# Patient Record
Sex: Male | Born: 1970
Health system: Southern US, Community
[De-identification: ages and names within clinical notes are randomized; demographics above are authoritative.]

## PROBLEM LIST (undated history)

## (undated) HISTORY — PX: ARTHROSCOPIC REPAIR ACL: SUR80

## (undated) HISTORY — PX: ACHILLES TENDON REPAIR: SUR1153

---

## 2001-03-19 ENCOUNTER — Encounter: Payer: Self-pay | Admitting: *Deleted

## 2001-03-19 ENCOUNTER — Emergency Department (HOSPITAL_COMMUNITY): Admission: EM | Admit: 2001-03-19 | Discharge: 2001-03-19 | Payer: Self-pay | Admitting: Emergency Medicine

## 2003-04-01 ENCOUNTER — Emergency Department (HOSPITAL_COMMUNITY): Admission: EM | Admit: 2003-04-01 | Discharge: 2003-04-01 | Payer: Self-pay | Admitting: Emergency Medicine

## 2009-01-12 ENCOUNTER — Emergency Department (HOSPITAL_COMMUNITY): Admission: EM | Admit: 2009-01-12 | Discharge: 2009-01-13 | Payer: Self-pay | Admitting: Emergency Medicine

## 2010-04-15 ENCOUNTER — Emergency Department (HOSPITAL_COMMUNITY): Payer: BC Managed Care – PPO

## 2010-04-15 ENCOUNTER — Emergency Department (HOSPITAL_COMMUNITY)
Admission: EM | Admit: 2010-04-15 | Discharge: 2010-04-16 | Disposition: A | Discharge status: Home / Self Care | Payer: BC Managed Care – PPO | Attending: Emergency Medicine | Admitting: Emergency Medicine

## 2010-04-15 DIAGNOSIS — X500XXA Overexertion from strenuous movement or load, initial encounter: Secondary | ICD-10-CM | POA: Insufficient documentation

## 2010-04-15 DIAGNOSIS — Y9367 Activity, basketball: Secondary | ICD-10-CM | POA: Insufficient documentation

## 2010-04-15 DIAGNOSIS — M66369 Spontaneous rupture of flexor tendons, unspecified lower leg: Secondary | ICD-10-CM | POA: Insufficient documentation

## 2010-04-15 DIAGNOSIS — S99929A Unspecified injury of unspecified foot, initial encounter: Secondary | ICD-10-CM | POA: Insufficient documentation

## 2010-04-15 DIAGNOSIS — S8990XA Unspecified injury of unspecified lower leg, initial encounter: Secondary | ICD-10-CM | POA: Insufficient documentation

## 2010-04-15 DIAGNOSIS — Y92838 Other recreation area as the place of occurrence of the external cause: Secondary | ICD-10-CM | POA: Insufficient documentation

## 2010-04-15 DIAGNOSIS — Y9239 Other specified sports and athletic area as the place of occurrence of the external cause: Secondary | ICD-10-CM | POA: Insufficient documentation

## 2010-04-15 IMAGING — CR DG ANKLE COMPLETE 3+V*R*
3 series · 3 of 3 positions shown · non-contrast
Comparison: None

CLINICAL DATA: Pain, injury playing basketball, felt pop
posteriorly, question Achilles injury

RIGHT ANKLE - COMPLETE 3+ VIEW

[t ankle joint ap right]
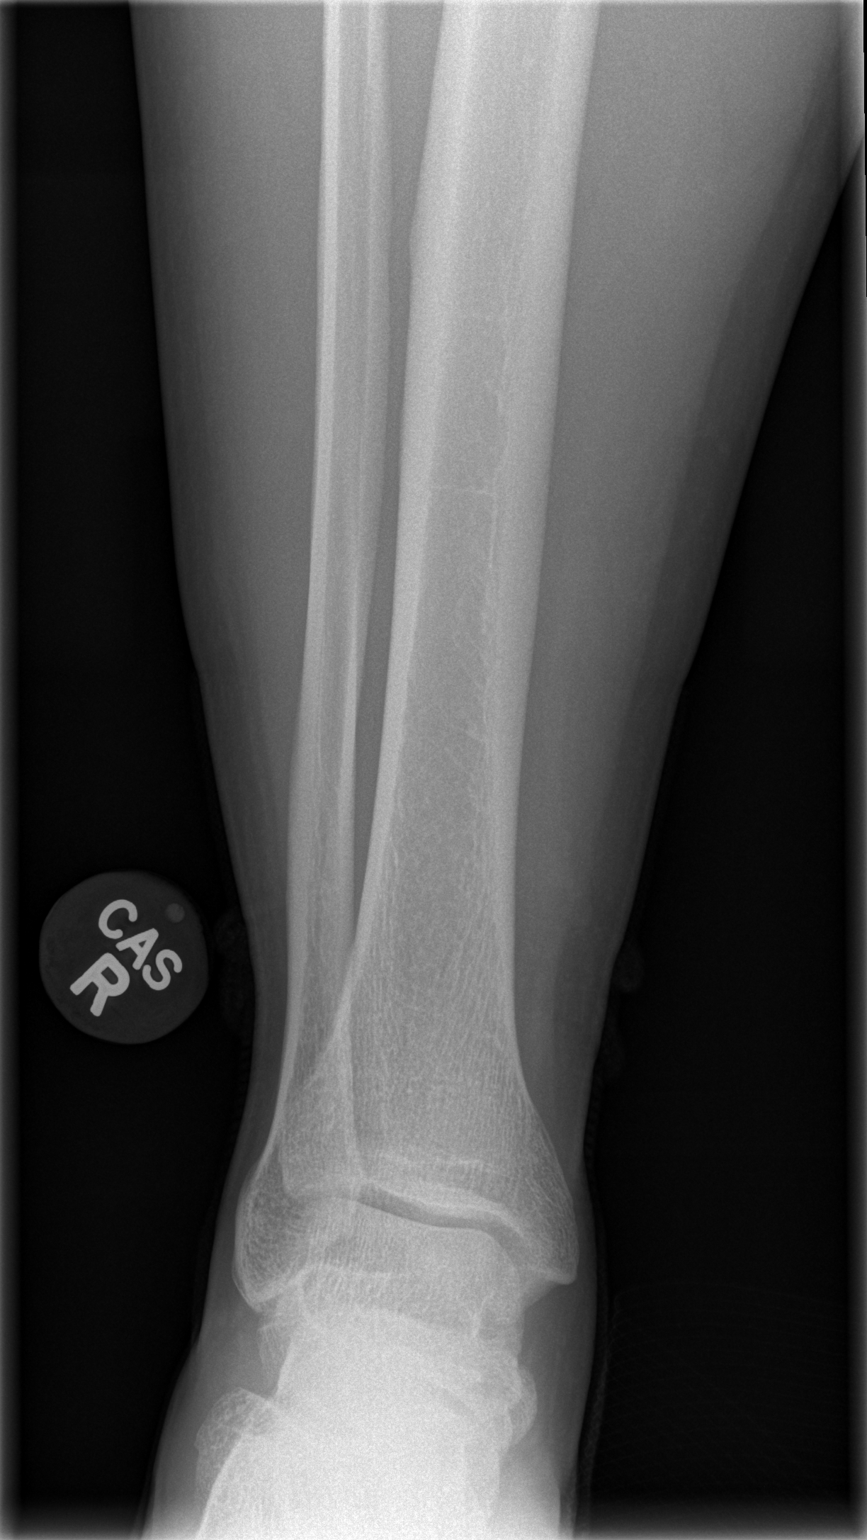

[t ankle joint oblique right]
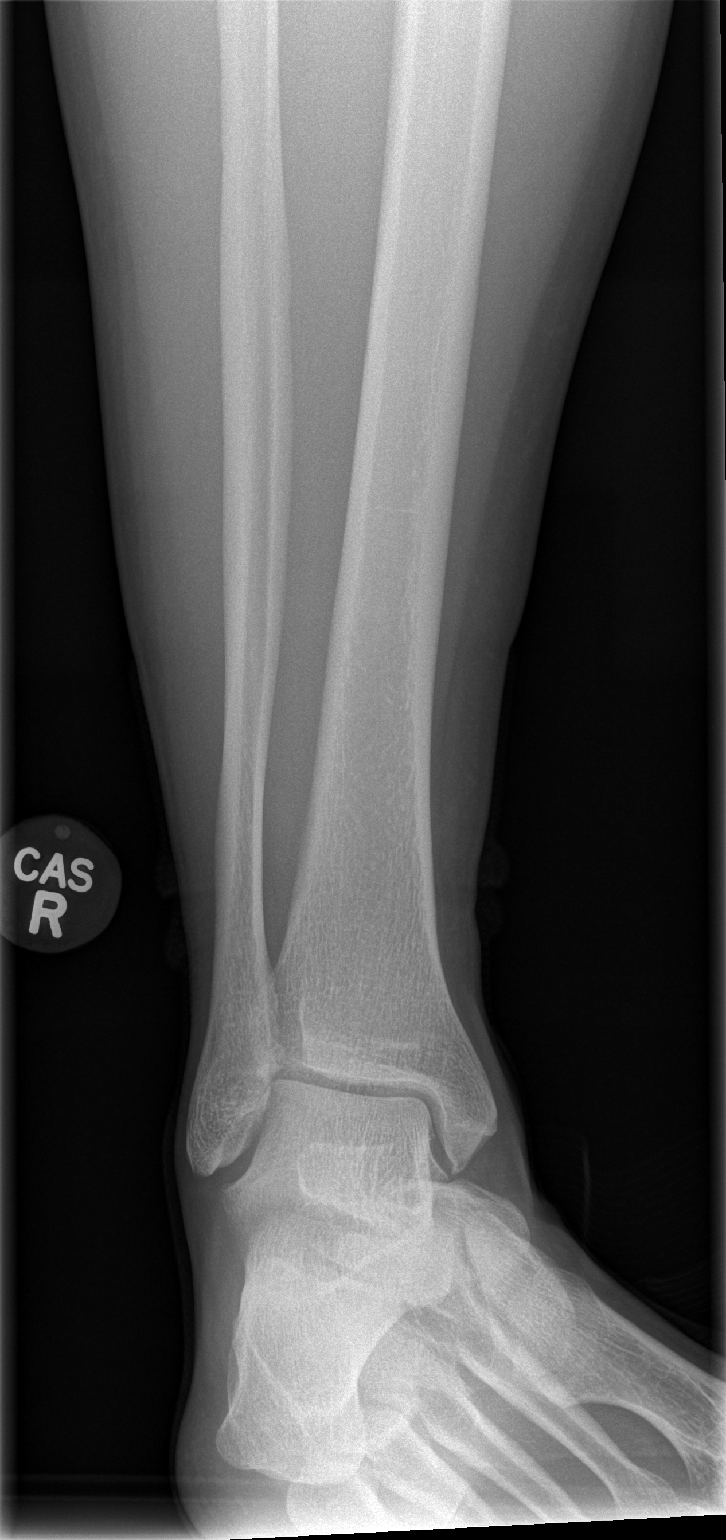

[t ankle joint lat right]
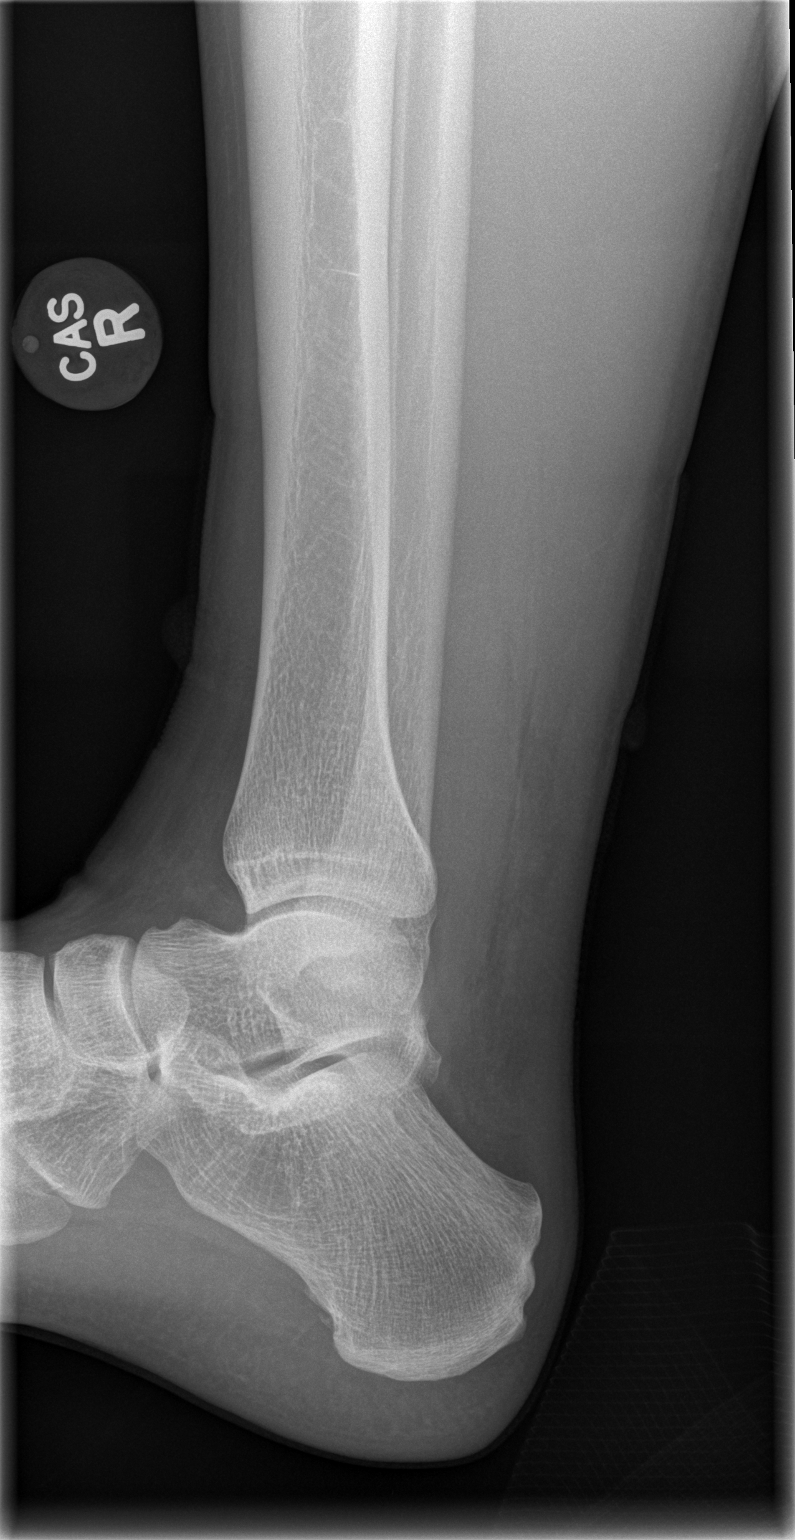

[3 of 3 positions shown; findings below may reference images not displayed]

FINDINGS: Ankle mortise intact.
Osseous mineralization normal.
No acute fracture or dislocation.
No bone destruction seen.
Question attenuation of the faint Achilles tendon silhouette at the
level of the musculotendinous junction, not well visualized.
IMPRESSION: No acute osseous abnormalities.
If there is clinical concern for Achilles tendon injury, recommend
MR imaging of the right ankle without contrast.

## 2010-04-21 ENCOUNTER — Other Ambulatory Visit: Payer: Self-pay | Admitting: Specialist

## 2010-04-21 ENCOUNTER — Encounter (HOSPITAL_COMMUNITY): Payer: BC Managed Care – PPO

## 2010-04-21 LAB — BASIC METABOLIC PANEL
BUN: 16 mg/dL (ref 6–23)
CO2: 29 mEq/L (ref 19–32)
Calcium: 9.4 mg/dL (ref 8.4–10.5)
Chloride: 103 mEq/L (ref 96–112)
Creatinine, Ser: 1.02 mg/dL (ref 0.4–1.5)
GFR calc Af Amer: >60 mL/min (ref >60)
GFR calc non Af Amer: >60 mL/min (ref >60)
Glucose, Bld: 94 mg/dL (ref 70–99)
Potassium: 4.3 mEq/L (ref 3.5–5.1)
Sodium: 138 mEq/L (ref 135–145)

## 2010-04-21 LAB — CBC
HCT: 43.5 % (ref 39.0–52.0)
Hemoglobin: 14.4 g/dL (ref 13.0–17.0)
MCH: 27.9 pg (ref 26.0–34.0)
MCHC: 33.1 g/dL (ref 30.0–36.0)
MCV: 84.1 fL (ref 78.0–100.0)
Platelets: 172 10*3/uL (ref 150–400)
RBC: 5.17 MIL/uL (ref 4.22–5.81)
RDW: 12.8 % (ref 11.5–15.5)
WBC: 4.7 10*3/uL (ref 4.0–10.5)

## 2010-04-21 LAB — SURGICAL PCR SCREEN
MRSA, PCR: NEGATIVE
Staphylococcus aureus: NEGATIVE

## 2010-04-23 ENCOUNTER — Observation Stay (HOSPITAL_COMMUNITY)
Admission: RE | Admit: 2010-04-23 | Discharge: 2010-04-24 | DRG: 227 | Disposition: A | Discharge status: Home / Self Care | Payer: BC Managed Care – PPO | Source: Ambulatory Visit | Attending: Specialist | Admitting: Specialist

## 2010-04-23 DIAGNOSIS — Z01812 Encounter for preprocedural laboratory examination: Secondary | ICD-10-CM

## 2010-04-23 DIAGNOSIS — S93499A Sprain of other ligament of unspecified ankle, initial encounter: Principal | ICD-10-CM | POA: Diagnosis present

## 2010-04-23 DIAGNOSIS — X58XXXA Exposure to other specified factors, initial encounter: Secondary | ICD-10-CM | POA: Diagnosis present

## 2010-05-24 NOTE — Op Note (Signed)
NAMEBRIDGER, Joshua Harding            ACCOUNT NO.:  [id number]  MEDICAL RECORD NO.:  [id number]           PATIENT TYPE:  I  LOCATION:  1605                         FACILITY:  [facility]  PHYSICIAN:  Jene Every, M.D.    DATE OF BIRTH:  [birth year]  DATE OF PROCEDURE:  04/23/2010 DATE OF DISCHARGE:  04/24/2010                              OPERATIVE REPORT   PREOPERATIVE DIAGNOSIS:  Achilles tendon rupture of the right ankle.  POSTOPERATIVE DIAGNOSIS:  Achilles tendon rupture of the right ankle.  PROCEDURE PERFORMED:  Achilles tendon repair of the right leg utilizing tendon autograft.  ANESTHESIA:  General.  ASSISTANT:  Roma Schanz, PA  BRIEF HISTORY:  This is a pleasant 40 year old gentleman who was playing ball, got acute severe pain in his right ankle and foot, was seen in the office, found to have a probable Achilles tendon tear.  He underwent an MRI, which tended to demonstrate extensive tearing of the Achilles tendon, full and was indicated for repair of the tendon.  Risks and benefits discussed including bleeding, infection, damage to vascular structures, no change in symptoms or worsening symptoms, DVT, PE, rupture, prolonged postoperative course, anesthetic complications, etc.  TECHNIQUE:  The patient in supine position, after induction of adequate general anesthesia, 2 g of Kefzol, he was placed prone on the operating room table, a slight bump beneath the legs.  With the knee slightly flexed, the right lower extremity was prepped and draped in the usual sterile fashion.  Thigh tourniquet inflated to 300 mmHg.  We made a curvilinear incision based on the posterior aspect of the ankle.  This curved over the tear, which was about 6 cm from its insertion and then continued distally to the lateral aspect of the Achilles tendon. Subcutaneous tissue was dissected.  Electrocautery was utilized to achieve hemostasis meticulously.  We identified the fascia and divided it  carefully after making a small incision, lifting up bone to the fascia and placing retractors beneath that.  We divided the fascia down to the tendon.  The venous plexus was noted.  The sural nerve was identified, gently retracted laterally.  There was extensive tearing of the Achilles full thickness tear with elongation and tearing of the ends extending down at least on the medial side to near to approximately 1 cm from its insertion on the calcaneus.  There was a significant defect noted there.  It was small.  Very little of the paratenon was present. We irrigated and removed the clot.  Debrided the ends with multiple configurations with the foot plantar flexed, approximating the tendon we felt that this will require some type of augmentation graft as there was still a significant defect within the tendon itself.  I therefore extended the incision proximally again taking care to preserve the sural nerve.  We then mobilized the tendon and we used a #1 FiberWire with a baseball-type stitch through the proximal portion of the tendon and thendistally to insert into the stump of the remaining tendon, and then continuous stitch distal with the two ends approximated.  We tied a good surgical knot with that.  This was oversewn with Vicryl interrupted suture.  Again, the defect was noted more medially at the area of the insertion.  I therefore harvested a portion of the tendo-Achilles proximal that is approximately a centimeter in width, reflected it a centimeter in width and dissected it from its underlying musculature and reflected it distally with its insertion still intact and used that to braid around and through the defect distally.  This was then oversewn with #1 Vicryl interrupted figure-of-8 sutures.  This filled the defect in the tendon satisfactorily.  Proximally, we reapproximated the tendinous harvest site with Vicryl interrupted figure-of-8 sutures. This was with the foot in plantar  flexion.  I felt there was good approximation and repair of the tendon with this autograft augmentation. I was able to place the foot at the neutral and slightly dorsiflexed position without any sign of dehiscence of the suture repair.  With it back in plantar flexion, I then copiously irrigated.  We felt that this would be satisfactory for healing of the tendon.  We then repaired at least what was remaining of the paratenon with 2-0 Vicryl interrupted figure-of-8 sutures, again taking care to preserve the sural nerve.  We then closed the subcu with 2-0 Vicryl simple sutures and the skin was reapproximated with staples.  Tourniquet was deflated and revascularization of the lower extremity appreciated.  We placed the patient in a well-padded posterior splint and secured it with Ace bandage.  He was then extubated without difficulty in the supine position and transported to the recovery room in satisfactory condition.  The patient tolerated procedure well.  No complications.  Minimal blood loss.  Tourniquet time approximately an hour.     Jene Every, M.D.     Cordelia Pen  D:  05/13/2010  T:  05/13/2010  Job:  [id number]  Electronically Signed by Jene Every M.D. on 05/24/2010 02:22:55 PM

## 2010-05-24 NOTE — Discharge Summary (Signed)
  Joshua Harding, Joshua Harding            ACCOUNT NO.:  [id number]  MEDICAL RECORD NO.:  [id number]           PATIENT TYPE:  I  LOCATION:  1605                         FACILITY:  [facility]  PHYSICIAN:  Jene Every, M.D.    DATE OF BIRTH:  [birth year]  DATE OF ADMISSION:  04/23/2010 DATE OF DISCHARGE:  04/24/2010                              DISCHARGE SUMMARY   ADMISSION DIAGNOSIS:  Right Achilles rupture.  DISCHARGE DIAGNOSIS:  Right Achilles rupture, status post Achilles repair.  HOSPITAL COURSE:  Uneventful.  DISPOSITION:  On postop day #1, the patient was stable to be discharged to home.  He is to follow up with Dr. Shelle Iron in approximately 1 week for wound check.  ACTIVITY:  He has been nonweightbearing on the right lower extremity. Continue with ice and elevation.  DIET:  As tolerated.  MEDICATIONS:  As per med rec sheet.  The patient will need to take aspirin on daily basis for DVT prophylaxis.  CONDITION ON DISCHARGE:  Stable.  DIAGNOSIS:  Status post right Achilles repair.     Roma Schanz, P.A.   ______________________________ Jene Every, M.D.    CS/MEDQ  D:  05/04/2010  T:  05/05/2010  Job:  [id number]  Electronically Signed by Roma Schanz P.A. on 05/11/2010 05:53:44 PM Electronically Signed by Jene Every M.D. on 05/24/2010 02:22:57 PM

## 2013-08-21 ENCOUNTER — Encounter (HOSPITAL_COMMUNITY): Payer: Self-pay | Admitting: Emergency Medicine

## 2013-08-21 ENCOUNTER — Emergency Department (HOSPITAL_COMMUNITY)
Admission: EM | Admit: 2013-08-21 | Discharge: 2013-08-21 | Disposition: A | Discharge status: Home / Self Care | Payer: Worker's Compensation | Attending: Emergency Medicine | Admitting: Emergency Medicine

## 2013-08-21 DIAGNOSIS — IMO0002 Reserved for concepts with insufficient information to code with codable children: Secondary | ICD-10-CM | POA: Insufficient documentation

## 2013-08-21 DIAGNOSIS — Z23 Encounter for immunization: Secondary | ICD-10-CM | POA: Insufficient documentation

## 2013-08-21 DIAGNOSIS — Y9389 Activity, other specified: Secondary | ICD-10-CM | POA: Diagnosis not present

## 2013-08-21 DIAGNOSIS — S0510XA Contusion of eyeball and orbital tissues, unspecified eye, initial encounter: Secondary | ICD-10-CM | POA: Insufficient documentation

## 2013-08-21 DIAGNOSIS — S01112A Laceration without foreign body of left eyelid and periocular area, initial encounter: Secondary | ICD-10-CM

## 2013-08-21 DIAGNOSIS — Y929 Unspecified place or not applicable: Secondary | ICD-10-CM | POA: Diagnosis not present

## 2013-08-21 DIAGNOSIS — Z791 Long term (current) use of non-steroidal anti-inflammatories (NSAID): Secondary | ICD-10-CM | POA: Insufficient documentation

## 2013-08-21 DIAGNOSIS — S0180XA Unspecified open wound of other part of head, initial encounter: Secondary | ICD-10-CM | POA: Diagnosis not present

## 2013-08-21 MED ORDER — TETANUS-DIPHTH-ACELL PERTUSSIS 5-2.5-18.5 LF-MCG/0.5 IM SUSP
0.5000 mL | Freq: Once | INTRAMUSCULAR | Status: AC
  Administered 2013-08-21: 0.5 mL via INTRAMUSCULAR
  Filled 2013-08-21: qty 0.5

## 2013-08-21 NOTE — ED Notes (Signed)
Small lac/abrasion to left brow. Pt states tetanus is not up to date. Pt states he was hit in the eye with a pop rod of a hood while performing appraisal. NAD

## 2013-08-21 NOTE — ED Notes (Signed)
Pt with small lac to left eyebrow after cutting it, tetanus updated, wound cleaned

## 2013-08-21 NOTE — ED Provider Notes (Signed)
CSN: [id number]     Arrival date & time 08/21/13  1117 History   First MD Initiated Contact with Patient 08/21/13 1249     Chief Complaint  Patient presents with  . Eye Injury     (Consider location/radiation/quality/duration/timing/severity/associated sxs/prior Treatment) Patient is a 43 y.o. male presenting with skin laceration. The history is provided by the patient.  Laceration Location:  Face Facial laceration location:  L eyebrow Length (cm):  1.5 cm Depth:  Cutaneous Bleeding: controlled   Laceration mechanism:  Metal edge Pain details:    Quality:  Aching   Severity:  Mild   Timing:  Constant   Progression:  Improving Foreign body present:  No foreign bodies Relieved by:  Pressure Worsened by:  Nothing tried Tetanus status:  Unknown  Joshua Harding is a 43 y.o. male who presents to the ED with a laceration to the left eyebrow. He was doing an appraisal on a car and the rod that was holding the good up slipped and hit his face. He has mild swelling and bruising to the area surrounding the laceration. He took ibuprofen for pain and it has helped. He denies any other injuries or problems.   History reviewed. No pertinent past medical history. Past Surgical History  Procedure Laterality Date  . Achilles tendon repair    . Arthroscopic repair acl     No family history on file. History  Substance Use Topics  . Smoking status: Never Smoker   . Smokeless tobacco: Not on file  . Alcohol Use: Yes     Comment: Occ    Review of Systems Negative except as stated in HPI   Allergies  Review of patient's allergies indicates no known allergies.  Home Medications   Prior to Admission medications   Medication Sig Start Date End Date Taking? Authorizing Provider  ibuprofen (ADVIL,MOTRIN) 200 MG tablet Take 600 mg by mouth every 6 (six) hours as needed for moderate pain.   Yes Historical Provider, MD   BP 143/79  Pulse 56  Temp(Src) 98.4 F (36.9 C) (Oral)  Resp 16   SpO2 100% Physical Exam  Nursing note and vitals reviewed. Constitutional: He is oriented to person, place, and time. He appears well-developed and well-nourished. No distress.  HENT:  Head:    Right Ear: Tympanic membrane normal.  Left Ear: Tympanic membrane normal.  Eyes: Conjunctivae and EOM are normal. Pupils are equal, round, and reactive to light.  Neck: Normal range of motion. Neck supple.  Cardiovascular: Normal rate.   Pulmonary/Chest: Effort normal.  Musculoskeletal: Normal range of motion.  Neurological: He is alert and oriented to person, place, and time. No cranial nerve deficit.  Skin: Skin is warm and dry.  Psychiatric: He has a normal mood and affect. His behavior is normal.    ED Course  Procedures LACERATION REPAIR Performed by: Carletha Dawn Authorized by: Gabrielle Wakeland Consent: Verbal consent obtained. Risks and benefits: risks, benefits and alternatives were discussed Consent given by: patient Patient identity confirmed: provided demographic data Prepped and Draped in normal sterile fashion Wound explored  Laceration Location: left eyebrow  Laceration Length: 1.5 cm  No Foreign Bodies seen or palpated  Anesthesia: None  Cleaned with Saf-clens   Amount of cleaning: standard  Skin closure: dermabond  Patient tolerance: Patient tolerated the procedure well with no immediate complications.    Tetanus updated  MDM  43 y.o. male with laceration to the left eyebrow. Stable for discharge without neuro deficits. Will follow up as needed. Instructions  re Dermabond.  He will continue to take ibuprofen as needed for pain.     [facility] Bunnie Pion, [place] 08/21/13 [phone]

## 2013-08-27 NOTE — ED Provider Notes (Signed)
Medical screening examination/treatment/procedure(s) were performed by non-physician practitioner and as supervising physician I was immediately available for consultation/collaboration.   EKG Interpretation None        Maudry Diego, MD 08/27/13 [phone]

## 2016-02-16 DIAGNOSIS — A084 Viral intestinal infection, unspecified: Secondary | ICD-10-CM | POA: Diagnosis not present

## 2016-06-15 DIAGNOSIS — Z0000 Encounter for general adult medical examination without abnormal findings: Secondary | ICD-10-CM | POA: Diagnosis not present

## 2016-06-15 DIAGNOSIS — E7800 Pure hypercholesterolemia, unspecified: Secondary | ICD-10-CM | POA: Diagnosis not present

## 2017-01-24 DIAGNOSIS — R220 Localized swelling, mass and lump, head: Secondary | ICD-10-CM | POA: Diagnosis not present

## 2017-01-26 ENCOUNTER — Other Ambulatory Visit: Payer: Self-pay | Admitting: Family Medicine

## 2017-01-26 DIAGNOSIS — R220 Localized swelling, mass and lump, head: Secondary | ICD-10-CM

## 2017-01-31 ENCOUNTER — Ambulatory Visit
Admission: RE | Admit: 2017-01-31 | Discharge: 2017-01-31 | Disposition: A | Discharge status: Home / Self Care | Payer: 59 | Source: Ambulatory Visit | Attending: Family Medicine | Admitting: Family Medicine

## 2017-01-31 DIAGNOSIS — R220 Localized swelling, mass and lump, head: Secondary | ICD-10-CM

## 2017-02-12 ENCOUNTER — Other Ambulatory Visit: Payer: Self-pay | Admitting: Family Medicine

## 2017-02-12 DIAGNOSIS — R9389 Abnormal findings on diagnostic imaging of other specified body structures: Secondary | ICD-10-CM

## 2017-02-12 DIAGNOSIS — R220 Localized swelling, mass and lump, head: Secondary | ICD-10-CM

## 2017-02-17 ENCOUNTER — Ambulatory Visit
Admission: RE | Admit: 2017-02-17 | Discharge: 2017-02-17 | Disposition: A | Discharge status: Home / Self Care | Payer: 59 | Source: Ambulatory Visit | Attending: Family Medicine | Admitting: Family Medicine

## 2017-02-17 DIAGNOSIS — R220 Localized swelling, mass and lump, head: Secondary | ICD-10-CM | POA: Diagnosis not present

## 2017-02-17 DIAGNOSIS — R9389 Abnormal findings on diagnostic imaging of other specified body structures: Secondary | ICD-10-CM

## 2017-02-17 MED ORDER — IOPAMIDOL (ISOVUE-300) INJECTION 61%
75.0000 mL | Freq: Once | INTRAVENOUS | Status: AC | PRN
  Administered 2017-02-17: 75 mL via INTRAVENOUS

## 2017-03-17 DIAGNOSIS — D481 Neoplasm of uncertain behavior of connective and other soft tissue: Secondary | ICD-10-CM | POA: Diagnosis not present

## 2017-03-27 DIAGNOSIS — R220 Localized swelling, mass and lump, head: Secondary | ICD-10-CM | POA: Diagnosis not present

## 2017-03-29 DIAGNOSIS — R220 Localized swelling, mass and lump, head: Secondary | ICD-10-CM | POA: Diagnosis not present

## 2018-02-12 DIAGNOSIS — Z0000 Encounter for general adult medical examination without abnormal findings: Secondary | ICD-10-CM | POA: Diagnosis not present

## 2018-02-12 DIAGNOSIS — E7800 Pure hypercholesterolemia, unspecified: Secondary | ICD-10-CM | POA: Diagnosis not present

## 2020-05-14 DIAGNOSIS — Z8042 Family history of malignant neoplasm of prostate: Secondary | ICD-10-CM | POA: Diagnosis not present

## 2020-05-14 DIAGNOSIS — Z0000 Encounter for general adult medical examination without abnormal findings: Secondary | ICD-10-CM | POA: Diagnosis not present

## 2020-05-14 DIAGNOSIS — E7800 Pure hypercholesterolemia, unspecified: Secondary | ICD-10-CM | POA: Diagnosis not present

## 2021-05-28 DIAGNOSIS — Z125 Encounter for screening for malignant neoplasm of prostate: Secondary | ICD-10-CM | POA: Diagnosis not present

## 2021-05-28 DIAGNOSIS — Z0000 Encounter for general adult medical examination without abnormal findings: Secondary | ICD-10-CM | POA: Diagnosis not present

## 2021-05-28 DIAGNOSIS — E7800 Pure hypercholesterolemia, unspecified: Secondary | ICD-10-CM | POA: Diagnosis not present

## 2021-05-28 DIAGNOSIS — Z8042 Family history of malignant neoplasm of prostate: Secondary | ICD-10-CM | POA: Diagnosis not present

## 2021-12-01 DIAGNOSIS — N529 Male erectile dysfunction, unspecified: Secondary | ICD-10-CM | POA: Diagnosis not present

## 2021-12-27 DIAGNOSIS — K635 Polyp of colon: Secondary | ICD-10-CM | POA: Diagnosis not present

## 2021-12-27 DIAGNOSIS — Z1211 Encounter for screening for malignant neoplasm of colon: Secondary | ICD-10-CM | POA: Diagnosis not present

## 2021-12-27 DIAGNOSIS — D120 Benign neoplasm of cecum: Secondary | ICD-10-CM | POA: Diagnosis not present

## 2021-12-27 DIAGNOSIS — D125 Benign neoplasm of sigmoid colon: Secondary | ICD-10-CM | POA: Diagnosis not present

## 2021-12-27 DIAGNOSIS — D128 Benign neoplasm of rectum: Secondary | ICD-10-CM | POA: Diagnosis not present

## 2021-12-27 DIAGNOSIS — K649 Unspecified hemorrhoids: Secondary | ICD-10-CM | POA: Diagnosis not present

## 2021-12-27 DIAGNOSIS — D123 Benign neoplasm of transverse colon: Secondary | ICD-10-CM | POA: Diagnosis not present

## 2022-01-31 DIAGNOSIS — M25571 Pain in right ankle and joints of right foot: Secondary | ICD-10-CM | POA: Diagnosis not present

## 2022-02-07 DIAGNOSIS — M25571 Pain in right ankle and joints of right foot: Secondary | ICD-10-CM | POA: Diagnosis not present

## 2022-02-07 DIAGNOSIS — M67961 Unspecified disorder of synovium and tendon, right lower leg: Secondary | ICD-10-CM | POA: Diagnosis not present

## 2022-06-03 DIAGNOSIS — N529 Male erectile dysfunction, unspecified: Secondary | ICD-10-CM | POA: Diagnosis not present

## 2022-06-03 DIAGNOSIS — Z125 Encounter for screening for malignant neoplasm of prostate: Secondary | ICD-10-CM | POA: Diagnosis not present

## 2022-06-03 DIAGNOSIS — E7800 Pure hypercholesterolemia, unspecified: Secondary | ICD-10-CM | POA: Diagnosis not present

## 2022-06-03 DIAGNOSIS — Z0000 Encounter for general adult medical examination without abnormal findings: Secondary | ICD-10-CM | POA: Diagnosis not present

## 2023-06-16 DIAGNOSIS — E7800 Pure hypercholesterolemia, unspecified: Secondary | ICD-10-CM | POA: Diagnosis not present

## 2023-06-16 DIAGNOSIS — R7309 Other abnormal glucose: Secondary | ICD-10-CM | POA: Diagnosis not present

## 2023-06-16 DIAGNOSIS — N529 Male erectile dysfunction, unspecified: Secondary | ICD-10-CM | POA: Diagnosis not present

## 2023-06-16 DIAGNOSIS — Z0000 Encounter for general adult medical examination without abnormal findings: Secondary | ICD-10-CM | POA: Diagnosis not present

## 2023-08-24 DIAGNOSIS — D485 Neoplasm of uncertain behavior of skin: Secondary | ICD-10-CM | POA: Diagnosis not present

## 2023-08-24 DIAGNOSIS — Z1283 Encounter for screening for malignant neoplasm of skin: Secondary | ICD-10-CM | POA: Diagnosis not present

## 2023-08-24 DIAGNOSIS — D225 Melanocytic nevi of trunk: Secondary | ICD-10-CM | POA: Diagnosis not present

## 2023-08-24 DIAGNOSIS — L820 Inflamed seborrheic keratosis: Secondary | ICD-10-CM | POA: Diagnosis not present
